# Patient Record
Sex: Male | Born: 1985 | Race: White | Hispanic: No | Marital: Single | Smoking: Current some day smoker
Health system: Southern US, Community
[De-identification: ages and names within clinical notes are randomized; demographics above are authoritative.]

## PROBLEM LIST (undated history)

## (undated) DIAGNOSIS — F329 Major depressive disorder, single episode, unspecified: Secondary | ICD-10-CM

## (undated) DIAGNOSIS — F419 Anxiety disorder, unspecified: Secondary | ICD-10-CM

## (undated) DIAGNOSIS — F32A Depression, unspecified: Secondary | ICD-10-CM

## (undated) HISTORY — PX: NOSE SURGERY: SHX723

## (undated) HISTORY — DX: Major depressive disorder, single episode, unspecified: F32.9

## (undated) HISTORY — DX: Depression, unspecified: F32.A

## (undated) HISTORY — DX: Anxiety disorder, unspecified: F41.9

---
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
## 2011-07-30 ENCOUNTER — Ambulatory Visit (INDEPENDENT_AMBULATORY_CARE_PROVIDER_SITE_OTHER): Payer: BC Managed Care – PPO | Admitting: Family Medicine

## 2011-07-30 VITALS — BP 143/78 | HR 78 | Temp 97.7°F | Resp 16 | Ht 68.25 in | Wt 200.4 lb

## 2011-07-30 DIAGNOSIS — Z0000 Encounter for general adult medical examination without abnormal findings: Secondary | ICD-10-CM

## 2011-07-30 DIAGNOSIS — F3289 Other specified depressive episodes: Secondary | ICD-10-CM

## 2011-07-30 DIAGNOSIS — K625 Hemorrhage of anus and rectum: Secondary | ICD-10-CM

## 2011-07-30 DIAGNOSIS — F329 Major depressive disorder, single episode, unspecified: Secondary | ICD-10-CM

## 2011-07-30 DIAGNOSIS — F32A Depression, unspecified: Secondary | ICD-10-CM

## 2011-07-30 LAB — IFOBT (OCCULT BLOOD): IFOBT: POSITIVE

## 2011-07-30 MED ORDER — HYDROCORTISONE 2.5 % RE CREA: TOPICAL_CREAM | Freq: Two times a day (BID) | RECTAL | Status: AC

## 2011-07-30 MED ORDER — MIRTAZAPINE 15 MG PO TBDP: 15.0000 mg | ORAL_TABLET | Freq: Every day | ORAL | Status: DC

## 2011-07-30 MED ORDER — MIRTAZAPINE 7.5 MG PO TABS: 15.0000 mg | ORAL_TABLET | Freq: Every day | ORAL | Status: DC

## 2011-07-30 NOTE — Patient Instructions (Signed)
Anal Fissure, Adult An anal fissure is a small tear or crack in the skin around the anus. Bleeding from a fissure usually stops on its own within a few minutes. However, bleeding will often reoccur with each bowel movement until the crack heals.  CAUSES   Passing large, hard stools.   Frequent diarrheal stools.   Constipation.   Inflammatory bowel disease (Crohn's disease or ulcerative colitis).   Infections.   Anal sex.  SYMPTOMS   Small amounts of blood seen on your stools, on toilet paper, or in the toilet after a bowel movement.   Rectal bleeding.   Painful bowel movements.   Itching or irritation around the anus.  DIAGNOSIS Your caregiver will examine the anal area. An anal fissure can usually be seen with careful inspection. A rectal exam may be performed and a short tube (anoscope) may be used to examine the anal canal. TREATMENT   You may be instructed to take fiber supplements. These supplements can soften your stool to help make bowel movements easier.   Sitz baths may be recommended to help heal the tear. Do not use soap in the sitz baths.   Stool softener (Colace 100mg  daily for a month)  Rinse well after showering  A medicated cream or ointment may be prescribed to lessen discomfort.  HOME CARE INSTRUCTIONS   Maintain a diet high in fruits, whole grains, and vegetables. Avoid constipating foods like bananas and dairy products.   Take sitz baths as directed by your caregiver.   Drink enough fluids to keep your urine clear or pale yellow.   Only take over-the-counter or prescription medicines for pain, discomfort, or fever as directed by your caregiver. Do not take aspirin as this may increase bleeding.   Do not use ointments containing numbing medications (anesthetics) or hydrocortisone. They could slow healing.  SEEK MEDICAL CARE IF:   Your fissure is not completely healed within 3 days.   You have further bleeding.   You have a fever.   You have  diarrhea mixed with blood.   You have pain.   Your problem is getting worse rather than better.  MAKE SURE YOU:   Understand these instructions.   Will watch your condition.   Will get help right away if you are not doing well or get worse.  Document Released: 06/13/2005 Document Revised: 02/23/2011 Document Reviewed: 11/28/2010 [facility] Patient Information 2012 [name], [place].

## 2011-07-30 NOTE — Progress Notes (Signed)
  Subjective:    Patient ID: Christopher Dodson, male    DOB: 1986/02/06, 26 y.o.   MRN: [id number]  Rectal Bleeding  The current episode started today. The problem occurs occasionally. The pain is mild. The stool is described as bloody and mixed with blood. There was no prior unsuccessful therapy. Associated symptoms include rectal pain.   Patient says he's had blood in stool before over the past several years but today the bleeding was heavy, noted while at [facility] while working.  Patient is moving to [place] next week.   Review of Systems  Gastrointestinal: Positive for hematochezia, anal bleeding and rectal pain.  All other systems reviewed and are negative.       Objective:   Physical Exam  Abdominal: Soft. Bowel sounds are normal. He exhibits no distension and no mass. There is no tenderness.    Right sided anal fissure, no hemorrhoids      Assessment & Plan:  Anal fissure

## 2012-03-27 ENCOUNTER — Other Ambulatory Visit: Payer: Self-pay | Admitting: Family Medicine

## 2012-03-27 NOTE — Telephone Encounter (Signed)
Please pull chart.

## 2012-03-28 NOTE — Telephone Encounter (Signed)
Chart pulled to PA pool [id number]

## 2014-01-31 DIAGNOSIS — F3289 Other specified depressive episodes: Secondary | ICD-10-CM | POA: Insufficient documentation

## 2014-01-31 DIAGNOSIS — F329 Major depressive disorder, single episode, unspecified: Secondary | ICD-10-CM | POA: Insufficient documentation

## 2014-01-31 DIAGNOSIS — F411 Generalized anxiety disorder: Secondary | ICD-10-CM | POA: Insufficient documentation

## 2014-01-31 DIAGNOSIS — Z760 Encounter for issue of repeat prescription: Secondary | ICD-10-CM | POA: Insufficient documentation

## 2014-02-01 ENCOUNTER — Emergency Department (HOSPITAL_COMMUNITY)
Admission: EM | Admit: 2014-02-01 | Discharge: 2014-02-01 | Disposition: A | Discharge status: Home / Self Care | Payer: 59 | Attending: Emergency Medicine | Admitting: Emergency Medicine

## 2014-02-01 DIAGNOSIS — Z760 Encounter for issue of repeat prescription: Secondary | ICD-10-CM

## 2014-02-01 MED ORDER — MIRTAZAPINE 15 MG PO TABS: 15.0000 mg | ORAL_TABLET | Freq: Every day | ORAL | Status: DC

## 2014-02-01 NOTE — ED Notes (Signed)
Pt is here visiting family from [place] and requesting a refill mertazpine 15 mg.

## 2014-02-01 NOTE — Discharge Instructions (Signed)
Read the information below.  Use the prescribed medication as directed.  Please discuss all new medications with your pharmacist.  You may return to the Emergency Department at any time for worsening condition or any new symptoms that concern you.   ° ° °Medication Refill, Emergency Department °We have refilled your medication today as a courtesy to you. It is best for your medical care, however, to take care of getting refills done through your primary caregiver's office. They have your records and can do a better job of follow-up than we can in the emergency department. °On maintenance medications, we often only prescribe enough medications to get you by until you are able to see your regular caregiver. This is a more expensive way to refill medications. °In the future, please plan for refills so that you will not have to use the emergency department for this. °Thank you for your help. Your help allows us to better take care of the daily emergencies that enter our department. °Document Released: 09/30/2003 Document Revised: 09/05/2011 Document Reviewed: 09/20/2013 °ExitCare® Patient Information ©2015 [employer]. This information is not intended to replace advice given to you by your health care provider. Make sure you discuss any questions you have with your health care provider. ° °

## 2014-02-01 NOTE — ED Provider Notes (Signed)
Medical screening examination/treatment/procedure(s) were performed by non-physician practitioner and as supervising physician I was immediately available for consultation/collaboration.   EKG Interpretation None        Drianna Chandran F Rocky Rishel, MD 02/01/14 0056 

## 2014-02-01 NOTE — ED Provider Notes (Signed)
CSN: [id number]     Arrival date & time 01/31/14  2351 History   First MD Initiated Contact with Patient 02/01/14 0007     Chief Complaint  Patient presents with  . Medication Refill     (Consider location/radiation/quality/duration/timing/severity/associated sxs/prior Treatment) HPI  Patient presents to the emergency department requesting a medication refill. Patient takes Mirtazapine, 15 mg daily for depression and anxiety. He is visiting from [place] and forgot to bring his medications with him. His last dose was yesterday. States he is having some chills and hot flashes that he associates with withdrawing from the medication. States he otherwise feels fine. States that his depression and anxiety are generally well controlled. Denies panic attacks, suicidal or homicidal ideation. Denies chest pain, shortness of breath, fevers, recent or current illness.  No past medical history on file. No past surgical history on file. No family history on file. History  Substance Use Topics  . Smoking status: Never Smoker   . Smokeless tobacco: Not on file  . Alcohol Use: Not on file    Review of Systems  All other systems reviewed and are negative.     Allergies  Review of patient's allergies indicates no known allergies.  Home Medications   Prior to Admission medications   Medication Sig Start Date End Date Taking? Authorizing Provider  mirtazapine (REMERON) 15 MG tablet Take 1 tablet (15 mg total) by mouth at bedtime. 02/01/14   Trixie DredgeEmily Raechell Singleton, PA-C  mirtazapine (REMERON) 7.5 MG tablet Take 2 tablets (15 mg total) by mouth at bedtime. 07/30/11 08/29/11  Elvina SidleKurt Lauenstein, MD   BP 143/102  Pulse 97  Temp(Src) 97.7 F (36.5 C) (Oral)  Resp 15  SpO2 100% Physical Exam  Nursing note and vitals reviewed. Constitutional: He appears well-developed and well-nourished. No distress.  HENT:  Head: Normocephalic and atraumatic.  Neck: Neck supple.  Cardiovascular: Normal rate and regular rhythm.    Pulmonary/Chest: Effort normal and breath sounds normal. No respiratory distress. He has no wheezes. He has no rales.  Neurological: He is alert.  Skin: He is not diaphoretic.  Psychiatric: His speech is normal and behavior is normal. His mood appears anxious. He exhibits a depressed mood. He expresses no homicidal and no suicidal ideation.    ED Course  Procedures (including critical care time) Labs Review Labs Reviewed - No data to display  Imaging Review No results found.   EKG Interpretation None      MDM   Final diagnoses:  Medication refill    Patient presents to the emergency department for medication refill. He is not actually out of his medication has been taking it as prescribed but is traveling out-of-town and left his medication home. He notes chills and hot flashes associated with withdrawing from the medication, which he last took yesterday. Denies suicidal or homicidal ideation. I will give patient 7 day prescription to last him until he gets back home. Patient to followup with his psychiatrist.  Discussed treatment, and follow up  with patient.  Pt given return precautions.  Pt verbalizes understanding and agrees with plan.        TuckahoeEmily Raina Sole, PA-C 02/01/14 0023

## 2014-02-22 ENCOUNTER — Ambulatory Visit (INDEPENDENT_AMBULATORY_CARE_PROVIDER_SITE_OTHER): Payer: 59 | Admitting: Internal Medicine

## 2014-02-22 VITALS — BP 108/80 | HR 89 | Temp 97.8°F | Resp 18 | Ht 69.0 in | Wt 213.0 lb

## 2014-02-22 DIAGNOSIS — F4323 Adjustment disorder with mixed anxiety and depressed mood: Secondary | ICD-10-CM | POA: Insufficient documentation

## 2014-02-22 MED ORDER — MIRTAZAPINE 15 MG PO TABS: 15.0000 mg | ORAL_TABLET | Freq: Every day | ORAL | Status: DC

## 2014-02-22 NOTE — Progress Notes (Signed)
Subjective:  This chart was scribed for [name], MD by Carl Best, Medical Scribe. This patient was seen in Room 12 and the patient's care was started at 1:35 PM.   Patient ID: Christopher Dodson, male    DOB: [birth year], 28 y.o.   MRN: [id number]  HPI HPI Comments: Christopher Dodson is a 28 y.o. male who presents to the [facility] for a medication refill.  He was initially treated at [facility] and prescribed Mirtazapine in January 2013 after being diagnosed with anxiety, depression, and insomnia.  He was also hanging around people who were negative influences at that time but is now making new friends.  The height of his dose was  and he experienced relief to his symptoms but he is trying to ween off of his medication.  He is managing his withdraw symptoms well.  He does not have a family history of anxiety or depression.  He works at [employer].  His parents are divorced and is causing him some problems.  He can tolerate the divorce and stay out of the middle of it.  His pastor serves as his counselor and finds it very helpful.    Father and mother- both patients here at one time Past Medical History  Diagnosis Date   Anxiety    Depression    Past Surgical History  Procedure Laterality Date   Nose surgery     Family History  Problem Relation Age of Onset   Hyperlipidemia Father    Mental illness Brother    Heart disease Maternal Grandmother    Cancer Paternal Grandmother    Stroke Paternal Grandfather    History   Social History   Marital Status: Single    Spouse Name: N/A    Number of Children: N/A   Years of Education: N/A   Occupational History   Not on file.   Social History Main Topics   Smoking status: Never Smoker    Smokeless tobacco: Not on file   Alcohol Use: Not on file   Drug Use: Not on file   Sexual Activity: Not on file   Other Topics Concern   Not on file   Social History Narrative   No  narrative on file   No Known Allergies  Review of Systems   Objective:  Physical Exam  Nursing note and vitals reviewed. Constitutional: He is oriented to person, place, and time. He appears well-developed and well-nourished. No distress.  HENT:  Head: Normocephalic and atraumatic.  Eyes: Conjunctivae and EOM are normal. Pupils are equal, round, and reactive to light.  Neck: Neck supple.  Cardiovascular: Normal rate.   Pulmonary/Chest: Effort normal.  Neurological: He is alert and oriented to person, place, and time. No cranial nerve deficit.  Psychiatric: He has a normal mood and affect. His behavior is normal.     BP 108/80   Pulse 89   Temp(Src) 97.8 F (36.6 C) (Oral)   Resp 18   Ht  (1.753 m)   Wt 213 lb (96.616 kg)   BMI 31.44 kg/m2   SpO2 98%  Assessment & Plan:    I personally performed the services described in this documentation, which was scribed in my presence. The recorded information has been reviewed and is accurate. Adjustment disorder with mixed anxiety and depressed mood  Meds ordered this encounter  Medications   mirtazapine (REMERON) 15 MG tablet    Sig: Take 1 tablet (15 mg total) by  mouth at bedtime.    Dispense:  30 tablet    Refill:  2   Go ahead with plan for withdrawal taking 7.5 mg daily for 3 weeks and then every other day for 3 weeks Continue counseling Followup as needed

## 2014-02-24 NOTE — Addendum Note (Signed)
Addended by: Tonye Pearson on: 02/24/2014 11:11 AM   Modules accepted: Level of Service

## 2014-05-28 ENCOUNTER — Other Ambulatory Visit: Payer: Self-pay | Admitting: Internal Medicine

## 2014-06-24 ENCOUNTER — Other Ambulatory Visit: Payer: Self-pay | Admitting: Internal Medicine

## 2014-06-28 ENCOUNTER — Ambulatory Visit (INDEPENDENT_AMBULATORY_CARE_PROVIDER_SITE_OTHER): Payer: 59 | Admitting: Physician Assistant

## 2014-06-28 VITALS — BP 110/72 | HR 95 | Temp 97.5°F | Resp 18 | Ht 69.0 in | Wt 223.0 lb

## 2014-06-28 DIAGNOSIS — F4323 Adjustment disorder with mixed anxiety and depressed mood: Secondary | ICD-10-CM

## 2014-06-28 MED ORDER — MIRTAZAPINE 15 MG PO TABS: ORAL_TABLET | ORAL | Status: DC

## 2014-06-28 NOTE — Progress Notes (Signed)
Received a call from answering service.  The pt reports that his medication refill did not make it to the drug store.    Will call in the remeron for him- called and let him know

## 2014-06-28 NOTE — Progress Notes (Signed)
MRN: [id number] DOB: 12-01-1985  Subjective:   Christopher Dodson is a 29 y.o. male presenting for medication refill.  He is taking 1/2 tablets and is down to his last pills.  He was given a tapering amount at last visit 4 months ago.  He states that he did not taper the dosage, per last visit instructions because he felt "like he was withdrawing" from tapering to drastically, (as stated on webMD).  Withdrawing symptoms included depressive symptoms and insomnia.  He currently is not having these sxs on the 1/2 dose.  He denies SI/HI, feelings of worthlessness, loss of interest, abnormal appetite or sleep.  He denies any dizziness, palpitations, fatigue, or constipation.  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  He is able to sleep, with some nights of unrest, but denies fulfilling tasks, grandiose thoughts, or impulsivity.   Patient has attempted both zoloft, prozac, and xanax which worked, but apparently had difficulty with the medication, following a bad encounter with "synthetic weed".  This was about 7 years ago, when his parents divorced, which was incredibly difficult for him.  He currently does not engage in any illicit drug use.  He is a rare drinker and may occasionally smoke a cigar with a [employer].  He wears two necklaces that represent his catholic faith, and a braided waistband that is representative of his chastity--this was asked during the physical when the accessories were seen.  He denies any desire to wean himself off the medication due to religious obligations or obedience.  He reports seeing a psychologist in [place], and was advised that he should receive counseling and off his medication.  Christopher Dodson has a current medication list which includes the following prescription(s): mirtazapine and mirtazapine.   He has No Known  Allergies.  Christopher Dodson  has a past medical history of Anxiety and Depression. Also  has past surgical history that includes Nose surgery.  ROS As in subjective.  Objective:   Vitals: BP 110/72 mmHg  Pulse 95  Temp(Src) 97.5 F (36.4 C) (Oral)  Resp 18  Ht  (1.753 m)  Wt 223 lb (101.152 kg)  BMI 32.92 kg/m2  SpO2 97%  Physical Exam  Constitutional: He is oriented to person, place, and time and well-developed, well-nourished, and in no distress. No distress.  Cardiovascular: Normal rate, regular rhythm and normal heart sounds.  Exam reveals no gallop and no friction rub.   No murmur heard. Pulmonary/Chest: Effort normal and breath sounds normal. No respiratory distress. He has no wheezes.  Neurological: He is alert and oriented to person, place, and time.  Skin: Skin is warm and dry.  Psychiatric: His mood appears not anxious. He does not express impulsivity. He does not exhibit a depressed mood. He expresses no homicidal and no suicidal ideation. He expresses no suicidal plans and no homicidal plans. He exhibits ordered thought content, normal recent memory and normal remote memory.  Patient's affect is reserved mildly flat, and slightly distant at times.  Responses are very mildly delayed as he speaks with his eyes closed. He is not slurred, tangential, or have pressured speech. Visit began with poor eye contact, but he was able to give appropriate interaction, as orientation progressed.        Assessment and Plan :  29 year old male with pmh of anxiety and depression is  here today for a medication refill.    Adjustment disorder with mixed anxiety and depressed mood - Plan: mirtazapine (REMERON) 15 MG tablet -Discussed concerns for anxiety and depression resurfacing, as well as nearby facilities of care in hometown, [place], [place].  Alerted patient of sxs and crisis plans (mobile crisis, contacting office, and ED) for recurring sxs.  Also spent lengthy amount of time normalizing  mental health pharmocotherapeutics.    He has been taking 7.5 mg.  He will take 7.5 every other day for 3 weeks and then discontinue medication.  He will contact us if he decides to continue medication at regular dose.  He is aggreeable to this plan.  Trena Platt, PA-C [facility] 1/2/20162:17 PM

## 2014-06-28 NOTE — Patient Instructions (Signed)
Take a 1/2 tablet every other day for the next 3 weeks.  If you are beginning to have surfacing anxiety or depression, you may restart the 1/2 tablet every day as you did before.  But, please let us know that you have made this change.  I would like to see you back in 2-3 months.  Sooner, if you need.

## 2014-07-09 ENCOUNTER — Other Ambulatory Visit: Payer: Self-pay | Admitting: Internal Medicine

## 2014-08-27 ENCOUNTER — Telehealth: Payer: Self-pay

## 2014-08-27 DIAGNOSIS — F4323 Adjustment disorder with mixed anxiety and depressed mood: Secondary | ICD-10-CM

## 2014-08-27 NOTE — Telephone Encounter (Signed)
Pharm faxed req to [facility] mirtazapine. Called pt to check status of his tapering off med as discussed at Jan OV. Pt reported that he has been cutting the tablets down to 1/4 tabs and is taking that. When he tried to take less he has withdrawal SEs of insomnia and shaking. He would like to continue tapering off, but he needs more to do so. Please advise.

## 2014-08-28 MED ORDER — MIRTAZAPINE 15 MG PO TABS: ORAL_TABLET | ORAL | Status: DC

## 2014-08-28 NOTE — Telephone Encounter (Signed)
Went to half tab.  He keeps getting jitters.  Taking a half tab, every day.  He gets insomnia.    Agreed to do 1/2 tab every 2 days and off one day for three weeks, then 1/2 tab every other day for three weeks.  Then off.

## 2014-08-28 NOTE — Telephone Encounter (Signed)
Patient called to follow up on medication refill. He was told that it's been acknowledge and is waiting on a response

## 2014-08-29 ENCOUNTER — Telehealth: Payer: Self-pay

## 2014-08-29 DIAGNOSIS — F4323 Adjustment disorder with mixed anxiety and depressed mood: Secondary | ICD-10-CM

## 2014-08-29 NOTE — Telephone Encounter (Signed)
Patient states his pharmacy did not receive his rx for mirtazapine. Seen by Trena PlattStephanie English, PA-C. Can we resend?

## 2014-09-01 MED ORDER — MIRTAZAPINE 15 MG PO TABS: ORAL_TABLET | ORAL | Status: DC

## 2014-09-01 NOTE — Telephone Encounter (Signed)
Re-sent Rx, pt notified.

## 2014-10-22 ENCOUNTER — Ambulatory Visit (INDEPENDENT_AMBULATORY_CARE_PROVIDER_SITE_OTHER): Payer: 59

## 2014-10-22 ENCOUNTER — Ambulatory Visit (INDEPENDENT_AMBULATORY_CARE_PROVIDER_SITE_OTHER): Payer: 59 | Admitting: Family Medicine

## 2014-10-22 VITALS — BP 112/80 | HR 73 | Temp 98.1°F | Resp 16 | Ht 69.0 in | Wt 213.0 lb

## 2014-10-22 DIAGNOSIS — R195 Other fecal abnormalities: Secondary | ICD-10-CM

## 2014-10-22 DIAGNOSIS — R1084 Generalized abdominal pain: Secondary | ICD-10-CM | POA: Diagnosis not present

## 2014-10-22 LAB — POCT URINALYSIS DIPSTICK
Bilirubin, UA: NEGATIVE
Blood, UA: NEGATIVE
Glucose, UA: NEGATIVE
Ketones, UA: NEGATIVE
Leukocytes, UA: NEGATIVE
Nitrite, UA: NEGATIVE
Protein, UA: 30
Spec Grav, UA: 1.02
Urobilinogen, UA: 0.2
pH, UA: 7.5

## 2014-10-22 LAB — POCT CBC
Granulocyte percent: 58 % (ref 37–80)
HCT, POC: 48.7 % (ref 43.5–53.7)
Hemoglobin: 16.7 g/dL (ref 14.1–18.1)
Lymph, poc: 3.2 (ref 0.6–3.4)
MCH, POC: 28.9 pg (ref 27–31.2)
MCHC: 32.2 g/dL (ref 31.8–35.4)
MCV: 84.3 fL (ref 80–97)
MID (cbc): 0.8 (ref 0–0.9)
MPV: 8.3 fL (ref 0–99.8)
POC Granulocyte: 5.5 (ref 2–6.9)
POC LYMPH PERCENT: 33.5 % (ref 10–50)
POC MID %: 8.5 %M (ref 0–12)
Platelet Count, POC: 222 10*3/uL (ref 142–424)
RBC: 5.78 M/uL (ref 4.69–6.13)
RDW, POC: 12.5 %
WBC: 9.5 10*3/uL (ref 4.6–10.2)

## 2014-10-22 LAB — COMPLETE METABOLIC PANEL WITH GFR
ALT: 49 U/L (ref 0–53)
AST: 29 U/L (ref 0–37)
CO2: 30 mEq/L (ref 19–32)
Calcium: 10.1 mg/dL (ref 8.4–10.5)
Chloride: 101 mEq/L (ref 96–112)
GFR, Est African American: >89 mL/min
GFR, Est Non African American: >89 mL/min
Glucose, Bld: 93 mg/dL (ref 70–99)
Sodium: 139 mEq/L (ref 135–145)
Total Bilirubin: 0.7 mg/dL (ref 0.2–1.2)
Total Protein: 7.8 g/dL (ref 6.0–8.3)

## 2014-10-22 LAB — COMPLETE METABOLIC PANEL WITHOUT GFR
Albumin: 4.8 g/dL (ref 3.5–5.2)
Alkaline Phosphatase: 82 U/L (ref 39–117)
BUN: 14 mg/dL (ref 6–23)
Creat: 1.1 mg/dL (ref 0.50–1.35)
Potassium: 4.5 meq/L (ref 3.5–5.3)

## 2014-10-22 LAB — POCT UA - MICROSCOPIC ONLY
Casts, Ur, LPF, POC: NEGATIVE
Crystals, Ur, HPF, POC: NEGATIVE
Mucus, UA: NEGATIVE
RBC, urine, microscopic: NEGATIVE
Yeast, UA: NEGATIVE

## 2014-10-22 NOTE — Progress Notes (Addendum)
Chief Complaint:  Chief Complaint  Patient presents with  . Other    White particles in stool, today    HPI: Christopher Dodson is a 29 y.o. male who is here for a  2 day history of diffuse abdominal pain and also one incidence of nights sweats. He is actually here because he's worried he might have tape worms . His dog is being treated for tapeworms. He saw white particles in his stool  yesterday. He has anxiety/depression and is on mirtazapine. He is tapering off of it. He does not think that he is in  Visit. He was put on at because of some of the things that his stepmother said. He does not have any nausea, diarrhea. He does have some constipation. He had a bowel movement 3 hours ago. He did take Tums. He has had no fevers or chills. Does not drink alcohol regular. Denies any abdominal distention. He denies any right lower quadrant pain.  Generalized abdominal pain in the middle and upper abdomen.  Denies nausea, vomiting, she has been eating and drinking well. Denies fever.  Past Medical History  Diagnosis Date  . Anxiety   . Depression    Past Surgical History  Procedure Laterality Date  . Nose surgery     History   Social History  . Marital Status: Single    Spouse Name: N/A  . Number of Children: N/A  . Years of Education: N/A   Social History Main Topics  . Smoking status: Current Some Day Smoker  . Smokeless tobacco: Not on file  . Alcohol Use: Not on file  . Drug Use: Not on file  . Sexual Activity: Not on file   Other Topics Concern  . None   Social History Narrative   Family History  Problem Relation Age of Onset  . Hyperlipidemia Father   . Mental illness Brother   . Heart disease Maternal Grandmother   . Cancer Paternal Grandmother   . Stroke Paternal Grandfather    No Known Allergies Prior to Admission medications   Medication Sig Start Date End Date Taking? Authorizing Provider  mirtazapine (REMERON) 15 MG tablet Take a 1/2 pill daily-taper as  directed 09/01/14  Yes Stephanie D English, PA     ROS: The patient denies fevers,  night sweats, unintentional weight loss, chest pain, palpitations, wheezing, dyspnea on exertion, nausea, vomiting, dysuria, hematuria, melena, numbness, weakness, or tingling.   All other systems have been reviewed and were otherwise negative with the exception of those mentioned in the HPI and as above.    PHYSICAL EXAM: Filed Vitals:   10/22/14 1459  BP: 112/80  Pulse: 73  Temp: 98.1 F (36.7 C)  Resp: 16   Filed Vitals:   10/22/14 1459  Height: 5\' 9"  (1.753 m)  Weight: 213 lb (96.616 kg)   Body mass index is 31.44 kg/(m^2).  General: Alert, no acute distress HEENT:  Normocephalic, atraumatic, oropharynx patent. EOMI, PERRLA Cardiovascular:  Regular rate and rhythm, no rubs murmurs or gallops.  No Carotid bruits, radial pulse intact. No pedal edema.  Respiratory: Clear to auscultation bilaterally.  No wheezes, rales, or rhonchi.  No cyanosis, no use of accessory musculature GI: No organomegaly, abdomen is soft and non-tender, positive bowel sounds.  No masses. Skin: No rashes. Neurologic: Facial musculature symmetric. Psychiatric: Patient is appropriate throughout our interaction. Lymphatic: No cervical lymphadenopathy Musculoskeletal: Gait intact.   LABS: Results for orders placed or performed in visit on 10/22/14  POCT CBC  Result Value Ref Range   WBC 9.5 4.6 - 10.2 K/uL   Lymph, poc 3.2 0.6 - 3.4   POC LYMPH PERCENT 33.5 10 - 50 %L   MID (cbc) 0.8 0 - 0.9   POC MID % 8.5 0 - 12 %M   POC Granulocyte 5.5 2 - 6.9   Granulocyte percent 58.0 37 - 80 %G   RBC 5.78 4.69 - 6.13 M/uL   Hemoglobin 16.7 14.1 - 18.1 g/dL   HCT, POC 16.1 09.6 - 53.7 %   MCV 84.3 80 - 97 fL   MCH, POC 28.9 27 - 31.2 pg   MCHC 32.2 31.8 - 35.4 g/dL   RDW, POC 04.5 %   Platelet Count, POC 222 142 - 424 K/uL   MPV 8.3 0 - 99.8 fL  POCT UA - Microscopic Only  Result Value Ref Range   WBC, Ur, HPF, POC 0-2     RBC, urine, microscopic neg    Bacteria, U Microscopic moderate    Mucus, UA neg    Epithelial cells, urine per micros 0-2    Crystals, Ur, HPF, POC neg    Casts, Ur, LPF, POC neg    Yeast, UA neg   POCT urinalysis dipstick  Result Value Ref Range   Color, UA yellow    Clarity, UA cloudy    Glucose, UA neg    Bilirubin, UA neg    Ketones, UA neg    Spec Grav, UA 1.020    Blood, UA neg    pH, UA 7.5    Protein, UA 30    Urobilinogen, UA 0.2    Nitrite, UA neg    Leukocytes, UA Negative      EKG/XRAY:   Primary read interpreted by Dr. Conley Rolls at [facility]. Moderate stool burden    ASSESSMENT/PLAN: Encounter Diagnoses  Name Primary?  . Generalized abdominal discomfort Yes  . Nonspecific abnormal finding in stool contents     Miralax  daily to twice a day When necessary for constipation Push  fluids Labs pending:  Stool Culture, O&P, CMP I don't suspect that he has tapeworms. He probably has constipation. He did eat rice yesterday and it may be the rice pellets but I suspect that it might be just normal mucus that might be in his stool when he was trying to strain to have a bowel movement.  Was unable to give [place] a stool sample in-house appropriate in amount and so will have to return and drop off a sttool specimen.   Gross sideeffects, risk and benefits, and alternatives of medications d/w patient. Patient is aware that all medications have potential sideeffects and we are unable to predict every sideeffect or drug-drug interaction that may occur.  Rockne Coons, DO 10/22/2014 4:29 PM    Late entry,  called the patient through [facility] and   Spoke with patient about his labs. He is sxs free , discussed with him that he has no worms or OP, stool did show some yeast but I would not treat unless he is actively having problems and not improving. Take some probiotics and see if he has any worsening stool changes. Currently he is much better.

## 2014-10-22 NOTE — Patient Instructions (Signed)
Abdominal Pain °Many things can cause abdominal pain. Usually, abdominal pain is not caused by a disease and will improve without treatment. It can often be observed and treated at home. Your health care provider will do a physical exam and possibly order blood tests and X-rays to help determine the seriousness of your pain. However, in many cases, more time must pass before a clear cause of the pain can be found. Before that point, your health care provider may not know if you need more testing or further treatment. °HOME CARE INSTRUCTIONS  °Monitor your abdominal pain for any changes. The following actions may help to alleviate any discomfort you are experiencing: °· Only take over-the-counter or prescription medicines as directed by your health care provider. °· Do not take laxatives unless directed to do so by your health care provider. °· Try a clear liquid diet (broth, tea, or water) as directed by your health care provider. Slowly move to a bland diet as tolerated. °SEEK MEDICAL CARE IF: °· You have unexplained abdominal pain. °· You have abdominal pain associated with nausea or diarrhea. °· You have pain when you urinate or have a bowel movement. °· You experience abdominal pain that wakes you in the night. °· You have abdominal pain that is worsened or improved by eating food. °· You have abdominal pain that is worsened with eating fatty foods. °· You have a fever. °SEEK IMMEDIATE MEDICAL CARE IF:  °· Your pain does not go away within 2 hours. °· You keep throwing up (vomiting). °· Your pain is felt only in portions of the abdomen, such as the right side or the left lower portion of the abdomen. °· You pass bloody or black tarry stools. °MAKE SURE YOU: °· Understand these instructions.   °· Will watch your condition.   °· Will get help right away if you are not doing well or get worse.   °Document Released: 03/23/2005 Document Revised: 06/18/2013 Document Reviewed: 02/20/2013 °[employer]® Patient Information  ©2015 [employer]. This information is not intended to replace advice given to you by your health care provider. Make sure you discuss any questions you have with your health care provider. ° °Constipation °Constipation is when a person has fewer than three bowel movements a week, has difficulty having a bowel movement, or has stools that are dry, hard, or larger than normal. As people grow older, constipation is more common. If you try to fix constipation with medicines that make you have a bowel movement (laxatives), the problem may get worse. Long-term laxative use may cause the muscles of the colon to become weak. A low-fiber diet, not taking in enough fluids, and taking certain medicines may make constipation worse.  °CAUSES  °· Certain medicines, such as antidepressants, pain medicine, iron supplements, antacids, and water pills.   °· Certain diseases, such as diabetes, irritable bowel syndrome (IBS), thyroid disease, or depression.   °· Not drinking enough water.   °· Not eating enough fiber-rich foods.   °· Stress or travel.   °· Lack of physical activity or exercise.   °· Ignoring the urge to have a bowel movement.   °· Using laxatives too much.   °SIGNS AND SYMPTOMS  °· Having fewer than three bowel movements a week.   °· Straining to have a bowel movement.   °· Having stools that are hard, dry, or larger than normal.   °· Feeling full or bloated.   °· Pain in the lower abdomen.   °· Not feeling relief after having a bowel movement.   °DIAGNOSIS  °Your health care provider will take   a medical history and perform a physical exam. Further testing may be done for severe constipation. Some tests may include: °· A barium enema X-ray to examine your rectum, colon, and, sometimes, your small intestine.   °· A sigmoidoscopy to examine your lower colon.   °· A colonoscopy to examine your entire colon. °TREATMENT  °Treatment will depend on the severity of your constipation and what is causing it. Some dietary  treatments include drinking more fluids and eating more fiber-rich foods. Lifestyle treatments may include regular exercise. If these diet and lifestyle recommendations do not help, your health care provider may recommend taking over-the-counter laxative medicines to help you have bowel movements. Prescription medicines may be prescribed if over-the-counter medicines do not work.  °HOME CARE INSTRUCTIONS  °· Eat foods that have a lot of fiber, such as fruits, vegetables, whole grains, and beans. °· Limit foods high in fat and processed sugars, such as french fries, hamburgers, cookies, candies, and soda.   °· A fiber supplement may be added to your diet if you cannot get enough fiber from foods.   °· Drink enough fluids to keep your urine clear or pale yellow.   °· Exercise regularly or as directed by your health care provider.   °· Go to the restroom when you have the urge to go. Do not hold it.   °· Only take over-the-counter or prescription medicines as directed by your health care provider. Do not take other medicines for constipation without talking to your health care provider first.   °SEEK IMMEDIATE MEDICAL CARE IF:  °· You have bright red blood in your stool.   °· Your constipation lasts for more than 4 days or gets worse.   °· You have abdominal or rectal pain.   °· You have thin, pencil-like stools.   °· You have unexplained weight loss. °MAKE SURE YOU:  °· Understand these instructions. °· Will watch your condition. °· Will get help right away if you are not doing well or get worse. °Document Released: 03/11/2004 Document Revised: 06/18/2013 Document Reviewed: 03/25/2013 °[employer]® Patient Information ©2015 [employer]. This information is not intended to replace advice given to you by your health care provider. Make sure you discuss any questions you have with your health care provider. ° °

## 2014-10-24 LAB — OVA AND PARASITE EXAMINATION: OP: NONE SEEN

## 2014-10-25 ENCOUNTER — Telehealth: Payer: Self-pay

## 2014-10-25 NOTE — Telephone Encounter (Signed)
Mr. Christopher Dodson called concerning his lab results today. He would like a call back with the information about his lab results.

## 2014-10-27 ENCOUNTER — Encounter: Payer: Self-pay | Admitting: Family Medicine

## 2014-10-27 LAB — STOOL CULTURE

## 2014-11-01 ENCOUNTER — Other Ambulatory Visit: Payer: Self-pay | Admitting: Family Medicine

## 2014-11-03 NOTE — Telephone Encounter (Signed)
Dr Conley RollsLe, at last OV pt was tapering off this med. Do you want to RF?

## 2014-11-04 ENCOUNTER — Encounter: Payer: Self-pay | Admitting: Family Medicine

## 2014-12-11 ENCOUNTER — Other Ambulatory Visit: Payer: Self-pay | Admitting: Family Medicine

## 2014-12-13 NOTE — Telephone Encounter (Signed)
Please advise on refill. Is she supposed to still be on this medication?

## 2015-03-05 ENCOUNTER — Other Ambulatory Visit: Payer: Self-pay | Admitting: Family Medicine

## 2015-03-11 ENCOUNTER — Other Ambulatory Visit: Payer: Self-pay | Admitting: Family Medicine

## 2015-04-04 ENCOUNTER — Other Ambulatory Visit: Payer: Self-pay | Admitting: Family Medicine

## 2015-05-02 ENCOUNTER — Other Ambulatory Visit: Payer: Self-pay | Admitting: Family Medicine

## 2015-05-05 NOTE — Telephone Encounter (Signed)
Patient is having his Remeron filled by psychiatrist. Per patient

## 2016-01-15 IMAGING — CR DG ABDOMEN 1V
1 series · 1 of 1 positions shown · non-contrast
Comparison: None.

CLINICAL DATA: 28-year-old male with a history of lower abdominal
pain.

EXAM:
ABDOMEN - 1 VIEW

[AP]
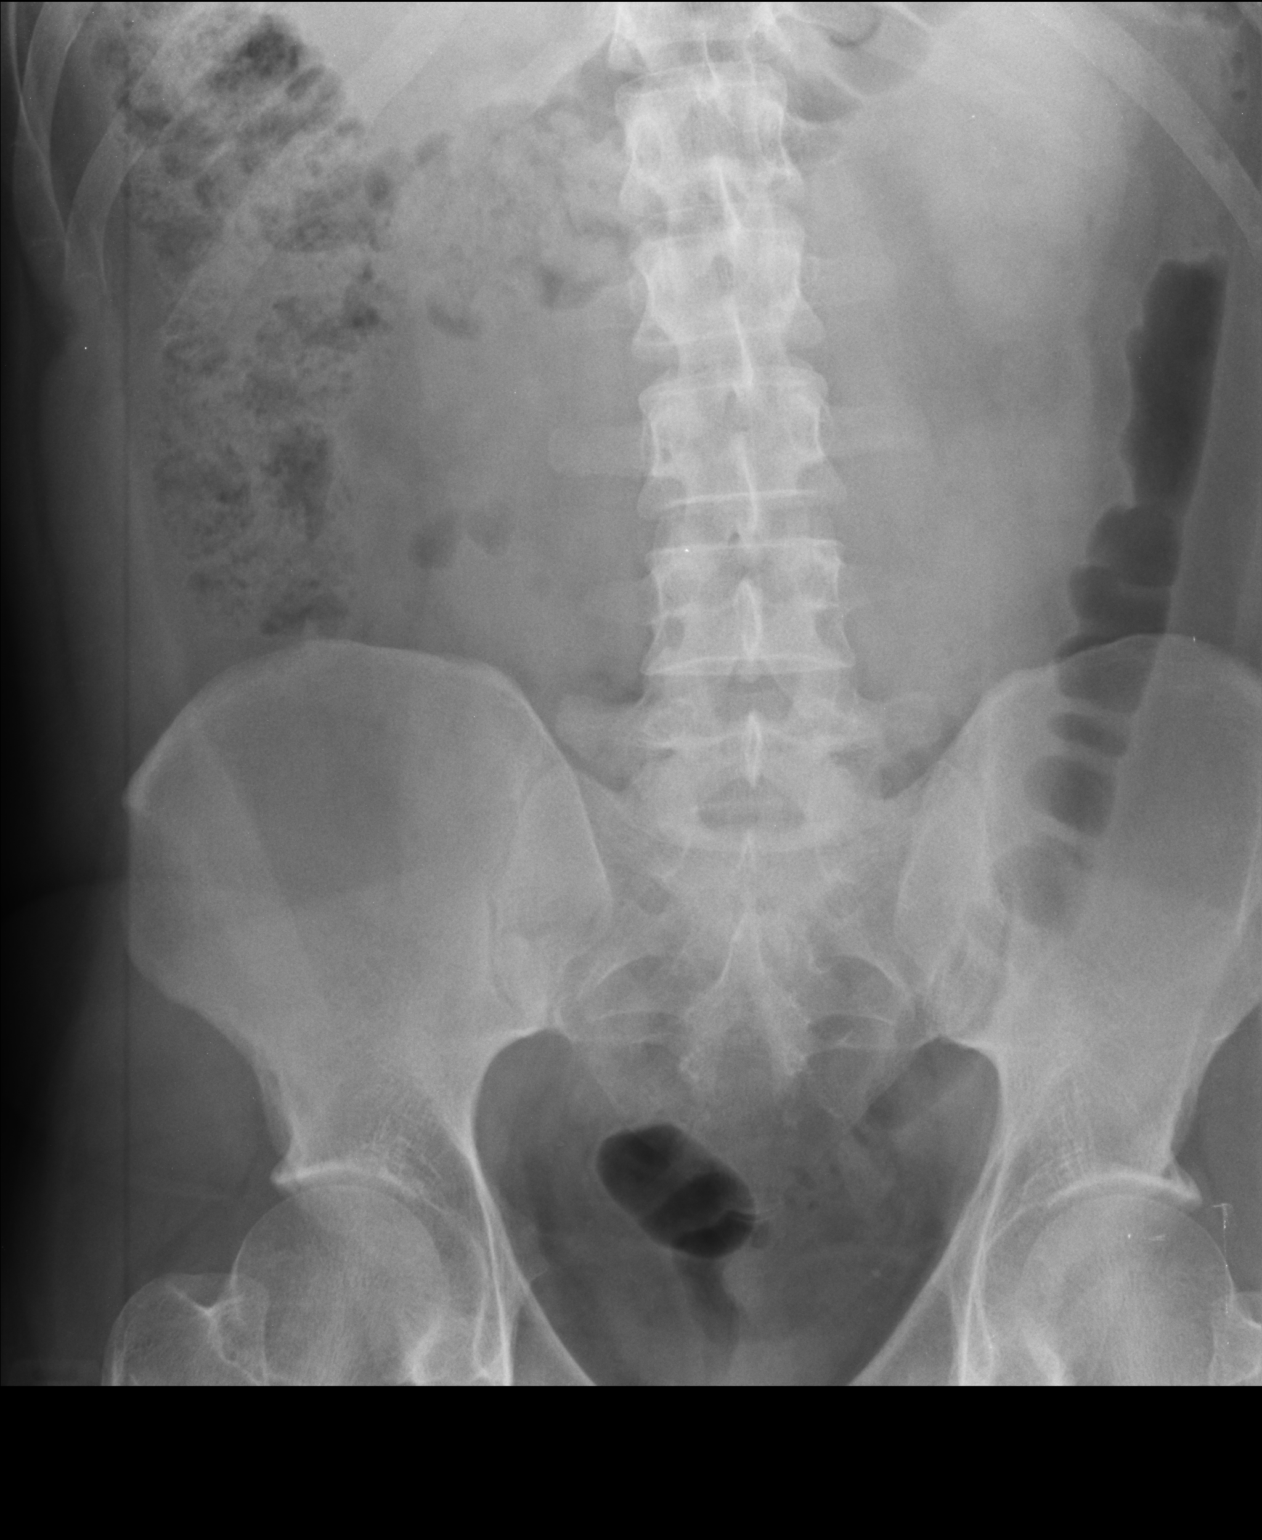

[1 of 1 positions shown; findings below may reference images not displayed]

FINDINGS: Gas within colon with small to moderate stool burden of the right
colon and transverse colon.

No unexpected radiopaque foreign body.

No displaced fracture.

No abnormally distended small bowel or colon loops.

No unexpected calcifications.
IMPRESSION: Normal bowel gas pattern with small to moderate stool burden.
